# Patient Record
Sex: Male | Born: 1970 | Race: White | Hispanic: No | Smoking: Current every day smoker
Health system: Southern US, Community
[De-identification: ages and names within clinical notes are randomized; demographics above are authoritative.]

## PROBLEM LIST (undated history)

## (undated) DIAGNOSIS — G253 Myoclonus: Secondary | ICD-10-CM

## (undated) DIAGNOSIS — G35 Multiple sclerosis: Secondary | ICD-10-CM

## (undated) HISTORY — PX: BACK SURGERY: SHX140

## (undated) HISTORY — PX: SURGERY SCROTAL / TESTICULAR: SUR1316

## (undated) HISTORY — PX: ANKLE SURGERY: SHX546

---

## 2020-05-03 ENCOUNTER — Other Ambulatory Visit: Payer: Self-pay

## 2020-05-03 ENCOUNTER — Emergency Department (HOSPITAL_BASED_OUTPATIENT_CLINIC_OR_DEPARTMENT_OTHER)
Admission: EM | Admit: 2020-05-03 | Discharge: 2020-05-03 | Disposition: A | Discharge status: Home / Self Care | Payer: Medicare Other | Attending: Emergency Medicine | Admitting: Emergency Medicine

## 2020-05-03 ENCOUNTER — Emergency Department (HOSPITAL_BASED_OUTPATIENT_CLINIC_OR_DEPARTMENT_OTHER): Payer: Medicare Other

## 2020-05-03 ENCOUNTER — Encounter (HOSPITAL_BASED_OUTPATIENT_CLINIC_OR_DEPARTMENT_OTHER): Payer: Self-pay | Admitting: Emergency Medicine

## 2020-05-03 DIAGNOSIS — S0990XA Unspecified injury of head, initial encounter: Secondary | ICD-10-CM | POA: Insufficient documentation

## 2020-05-03 DIAGNOSIS — Y999 Unspecified external cause status: Secondary | ICD-10-CM | POA: Diagnosis not present

## 2020-05-03 DIAGNOSIS — Y9259 Other trade areas as the place of occurrence of the external cause: Secondary | ICD-10-CM | POA: Insufficient documentation

## 2020-05-03 DIAGNOSIS — Y9389 Activity, other specified: Secondary | ICD-10-CM | POA: Insufficient documentation

## 2020-05-03 DIAGNOSIS — S0101XA Laceration without foreign body of scalp, initial encounter: Secondary | ICD-10-CM | POA: Insufficient documentation

## 2020-05-03 DIAGNOSIS — F17210 Nicotine dependence, cigarettes, uncomplicated: Secondary | ICD-10-CM | POA: Diagnosis not present

## 2020-05-03 HISTORY — DX: Multiple sclerosis: G35

## 2020-05-03 HISTORY — DX: Myoclonus: G25.3

## 2020-05-03 MED ORDER — ACETAMINOPHEN 325 MG PO TABS
650.0000 mg | ORAL_TABLET | Freq: Once | ORAL | Status: AC
  Administered 2020-05-03: 650 mg via ORAL
  Filled 2020-05-03: qty 2

## 2020-05-03 MED ORDER — LIDOCAINE HCL (PF) 1 % IJ SOLN
5.0000 mL | Freq: Once | INTRAMUSCULAR | Status: AC
  Administered 2020-05-03: 5 mL via INTRADERMAL
  Filled 2020-05-03: qty 5
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
## 2020-05-03 NOTE — ED Notes (Signed)
ED Provider at bedside. 
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
## 2020-05-03 NOTE — Discharge Instructions (Addendum)
Local wound care with bacitracin and dressing changes twice daily.  Sutures are to be removed in 1 week.  Please follow-up with your primary doctor for this.  Return to the emergency department for any new and/or concerning symptoms.
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
